# Patient Record
Sex: Female | Born: 1975 | Hispanic: Yes | Marital: Single | Smoking: Never smoker
Health system: Southern US, Community
[De-identification: ages and names within clinical notes are randomized; demographics above are authoritative.]

---

## 2013-05-03 ENCOUNTER — Encounter: Payer: Self-pay | Admitting: Obstetrics and Gynecology

## 2017-08-29 ENCOUNTER — Encounter (INDEPENDENT_AMBULATORY_CARE_PROVIDER_SITE_OTHER): Payer: Self-pay

## 2017-08-29 ENCOUNTER — Ambulatory Visit: Payer: Self-pay | Attending: Oncology

## 2017-08-29 VITALS — BP 114/78 | HR 61 | Temp 98.4°F | Ht 60.0 in | Wt 167.0 lb

## 2017-08-29 DIAGNOSIS — N63 Unspecified lump in unspecified breast: Secondary | ICD-10-CM

## 2017-08-29 NOTE — Progress Notes (Signed)
Subjective:     Patient ID: Kara Farmer, female   DOB: 21-Apr-1976, 42 y.o.   MRN: [id number]  HPI   Review of Systems     Objective:   Physical Exam  Pulmonary/Chest: Right breast exhibits no inverted nipple, no mass, no nipple discharge, no skin change and no tenderness. Left breast exhibits no inverted nipple, no mass, no nipple discharge, no skin change and no tenderness. Breasts are symmetrical.    Fibroglandular tissue upper outer quadrants bilateral; more prominent on left       Assessment:     42 year ols hispanic patient presents for [facility] clinic visit.  Patient complains of painful mass first noted 2 weeks ago that has gotten smaller in size.  Claretha CooperLoyda Murr interpreted exam.  No prior mammogram. Patient screened, and meets B[facility] eligibility.  Patient does not have insurance, Medicare or Medicaid.  Handout given on Affordable Care Act.Instructed patient on breast self awareness using teach back method.  CBE reveals bilateral upper outer quadrant fibroglandular tissue, more prominent on left.  No dominant mass palpated.  Patient staes she is had a pap at [facility] in January 2019.  Christy to request results.  Have a copy of [facility] pap [facility]  with negative /no HPV testing result.    Plan:     Joellyn QuailsChristy Burton to schedule for bilateral diagnostic mammogram, and ultrasound.

## 2017-09-01 ENCOUNTER — Ambulatory Visit
Admission: RE | Admit: 2017-09-01 | Discharge: 2017-09-01 | Disposition: A | Discharge status: Home / Self Care | Payer: Self-pay | Source: Ambulatory Visit | Attending: Oncology | Admitting: Oncology

## 2017-09-01 ENCOUNTER — Encounter: Payer: Self-pay | Admitting: Radiology

## 2017-09-01 DIAGNOSIS — N63 Unspecified lump in unspecified breast: Secondary | ICD-10-CM

## 2017-09-04 NOTE — Progress Notes (Signed)
Letter mailed from [facility] to notify of normal mammogram results.  Patient to return in one year for annual screening.  Copy to [facility]. 

## 2018-09-04 ENCOUNTER — Ambulatory Visit: Payer: Self-pay

## 2019-02-06 ENCOUNTER — Other Ambulatory Visit: Payer: Self-pay

## 2019-02-07 ENCOUNTER — Ambulatory Visit: Payer: Self-pay | Attending: Oncology | Admitting: *Deleted

## 2019-02-07 ENCOUNTER — Ambulatory Visit
Admission: RE | Admit: 2019-02-07 | Discharge: 2019-02-07 | Disposition: A | Discharge status: Home / Self Care | Payer: Self-pay | Source: Ambulatory Visit | Attending: Oncology | Admitting: Oncology

## 2019-02-07 ENCOUNTER — Encounter (INDEPENDENT_AMBULATORY_CARE_PROVIDER_SITE_OTHER): Payer: Self-pay

## 2019-02-07 ENCOUNTER — Other Ambulatory Visit: Payer: Self-pay

## 2019-02-07 ENCOUNTER — Encounter: Payer: Self-pay | Admitting: *Deleted

## 2019-02-07 VITALS — BP 114/77 | HR 58 | Temp 97.6°F | Ht 60.63 in | Wt 155.5 lb

## 2019-02-07 DIAGNOSIS — Z0000 Encounter for general adult medical examination without abnormal findings: Secondary | ICD-10-CM

## 2019-02-07 NOTE — Progress Notes (Signed)
  Subjective:     Patient ID: Kara Farmer, female   DOB: 11/18/1975, 43 y.o.   MRN: [id number]  HPI   Review of Systems     Objective:   Physical Exam Chest:     Breasts: Breasts are symmetrical.        Right: No swelling, bleeding, inverted nipple, mass, nipple discharge, skin change or tenderness.        Left: No swelling, bleeding, inverted nipple, mass, nipple discharge, skin change or tenderness.    Lymphadenopathy:     Upper Body:     Right upper body: No supraclavicular or axillary adenopathy.     Left upper body: No supraclavicular or axillary adenopathy.        Assessment:     43 year old Hispanic female returns to [facility] for annual screening.  Lloyda, the interpreter present during the interview and exam.  Clinical breast exam unremarkable.  Taught self breast awareness.  Patient with family history of breast cancer in her sister at age 75.  [place] breast cancer risk score is 18.7% and meets NCCN guidelines for genetic testing.  Discussed results and recommendation for genetic testing.  States she wants to think about it.  She is to call me back with her decision.  Last pap on 07/18/17 was negative/negative.  Next pap due in 2024. Patient has been screened for eligibility.  She does not have any insurance, Medicare or Medicaid.  She also meets financial eligibility.  Hand-out given on the Affordable Care Act.     Plan:     Screening mammogram ordered.  Will follow-up per [facility] protocol.

## 2019-02-08 ENCOUNTER — Encounter: Payer: Self-pay | Admitting: *Deleted

## 2019-02-08 NOTE — Progress Notes (Signed)
Letter mailed from the [facility] to inform patient of her normal mammogram results.  Patient is to follow-up with annual screening in one year.  [facility] to Christy. 

## 2020-04-15 ENCOUNTER — Other Ambulatory Visit: Payer: Self-pay

## 2020-04-15 ENCOUNTER — Ambulatory Visit: Payer: Self-pay | Attending: Oncology

## 2020-04-15 ENCOUNTER — Ambulatory Visit
Admission: RE | Admit: 2020-04-15 | Discharge: 2020-04-15 | Disposition: A | Discharge status: Home / Self Care | Payer: Self-pay | Source: Ambulatory Visit | Attending: Oncology | Admitting: Oncology

## 2020-04-15 VITALS — BP 120/66 | HR 61 | Temp 98.0°F | Resp 20 | Ht 61.0 in | Wt 153.5 lb

## 2020-04-15 DIAGNOSIS — Z0000 Encounter for general adult medical examination without abnormal findings: Secondary | ICD-10-CM

## 2020-04-15 NOTE — Progress Notes (Signed)
  Subjective:     Patient ID: Kara Farmer, female   DOB: 15-May-1976, 44 y.o.   MRN: [id number]  HPI   Review of Systems     Objective:   Physical Exam Chest:     Breasts:        Right: No swelling, bleeding, inverted nipple, mass, nipple discharge, skin change or tenderness.        Left: Tenderness present. No swelling, bleeding, inverted nipple, mass, nipple discharge or skin change.     Comments: Left upper outer breast tenderness-intermittent       Assessment:     44 year old Hispanic patient returns for annual [facility] screening.  Delos Haring interpreted exam. Patient screened, and meets [facility] eligibility.  Patient does not have insurance, Medicare or Medicaid. Instructed patient on breast self awareness using teach back method. Patient reports she is still having intermittent left breast tenderness.  Clinical breast exam unremarkable.      Plan:     Sent for bilateral screening mammogram.

## 2020-05-21 NOTE — Progress Notes (Signed)
Letter mailed from [facility] to notify of normal mammogram results.  Patient to return in one year for annual screening.  Copy to [facility]. 

## 2021-11-13 IMAGING — MG DIGITAL SCREENING BILAT W/ TOMO W/ CAD
8 series · 8 of 24 positions shown · non-contrast
Comparison: Previous exam(s).

CLINICAL DATA: Screening.

EXAM:
DIGITAL SCREENING BILATERAL MAMMOGRAM WITH TOMO AND CAD

[R MLO synth-2D]
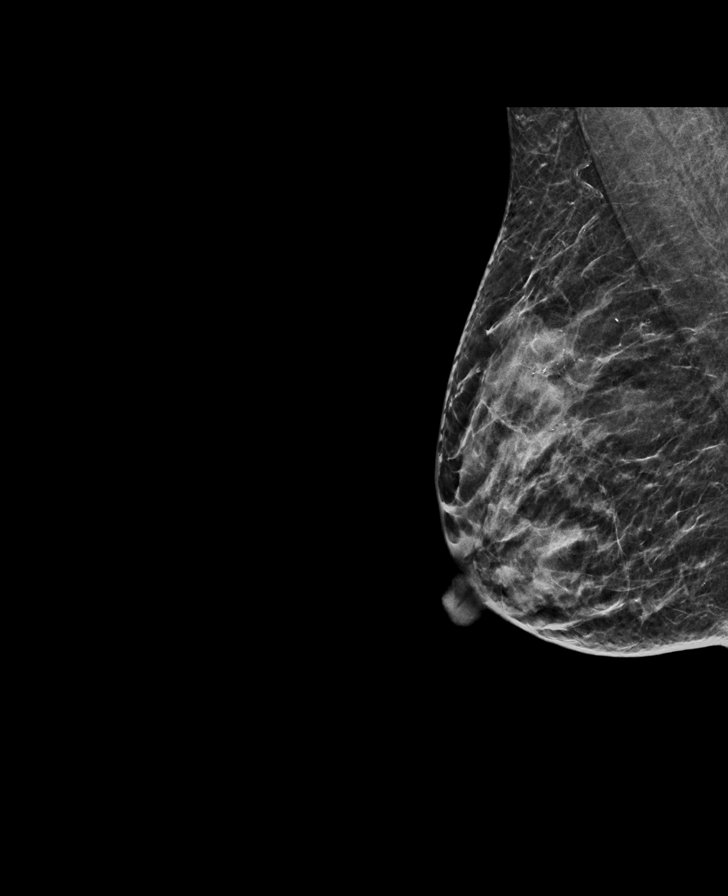

[L MLO synth-2D]
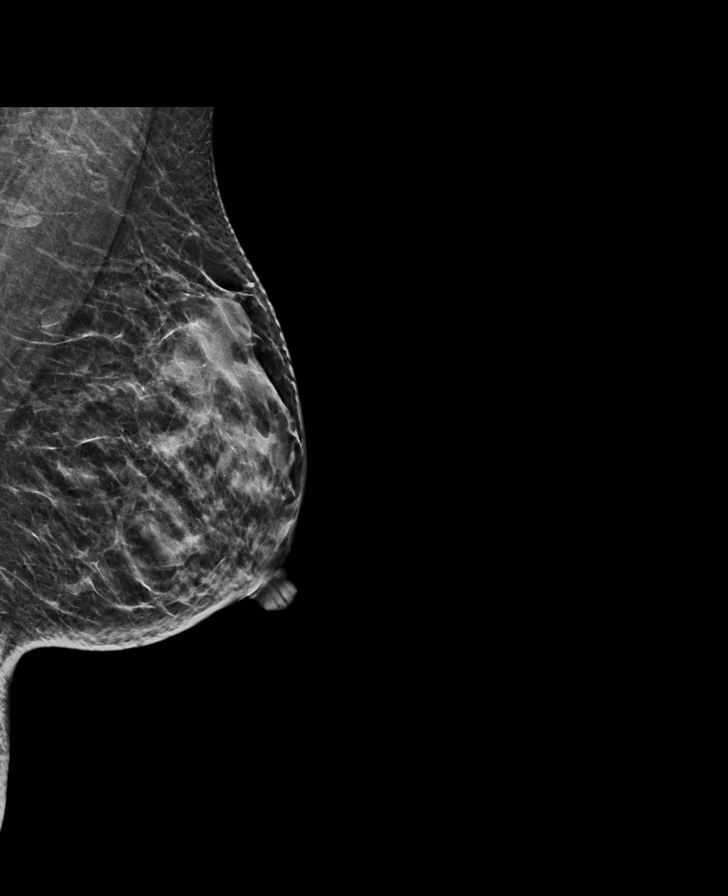

[R CC synth-2D]
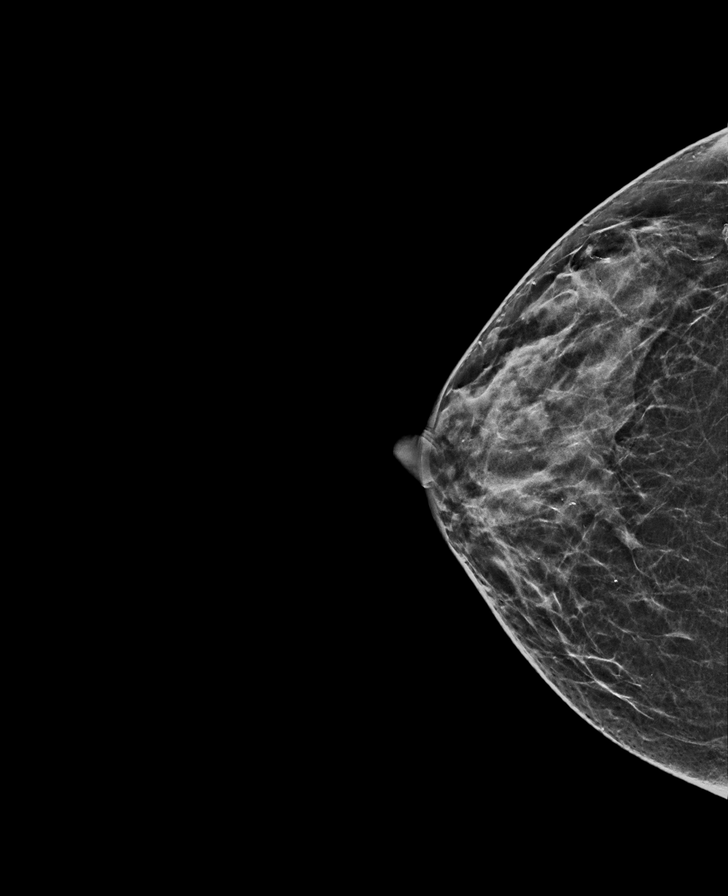

[L CC synth-2D]
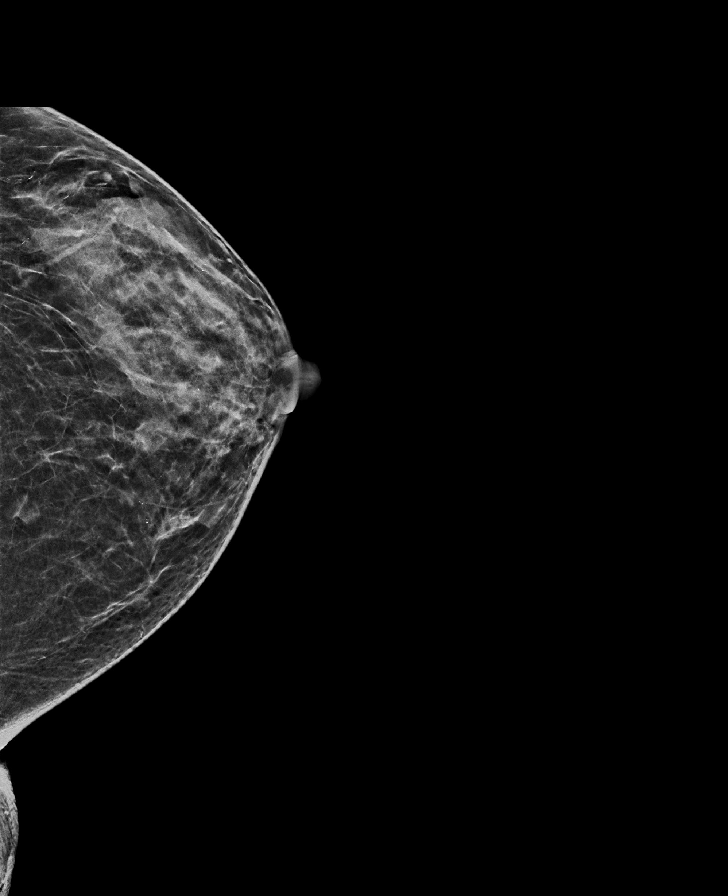

[R CC tomo · tomo slice 25/49.0]
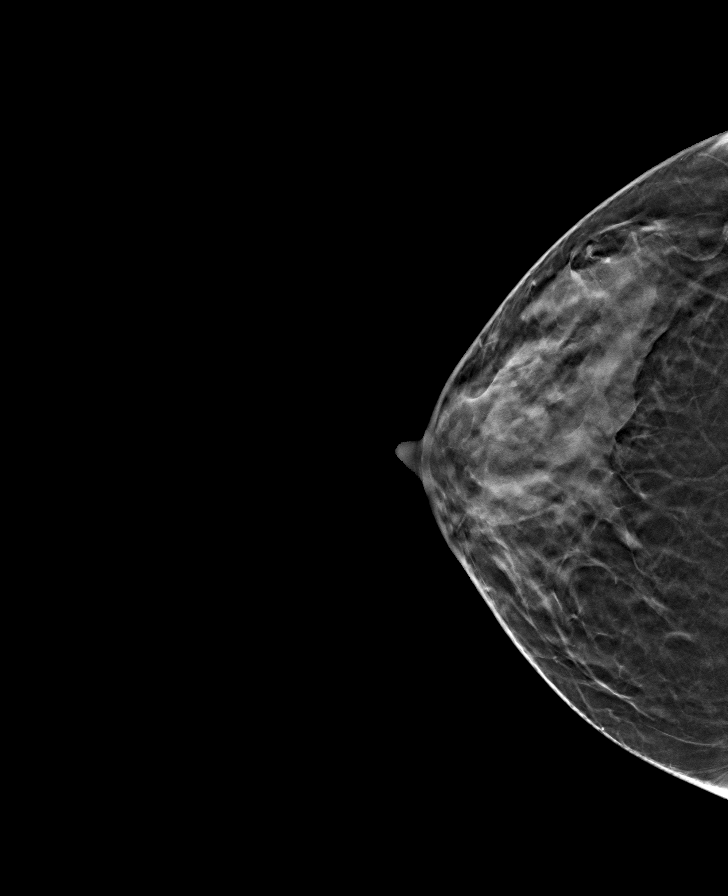

[R MLO tomo · tomo slice 25/49.0]
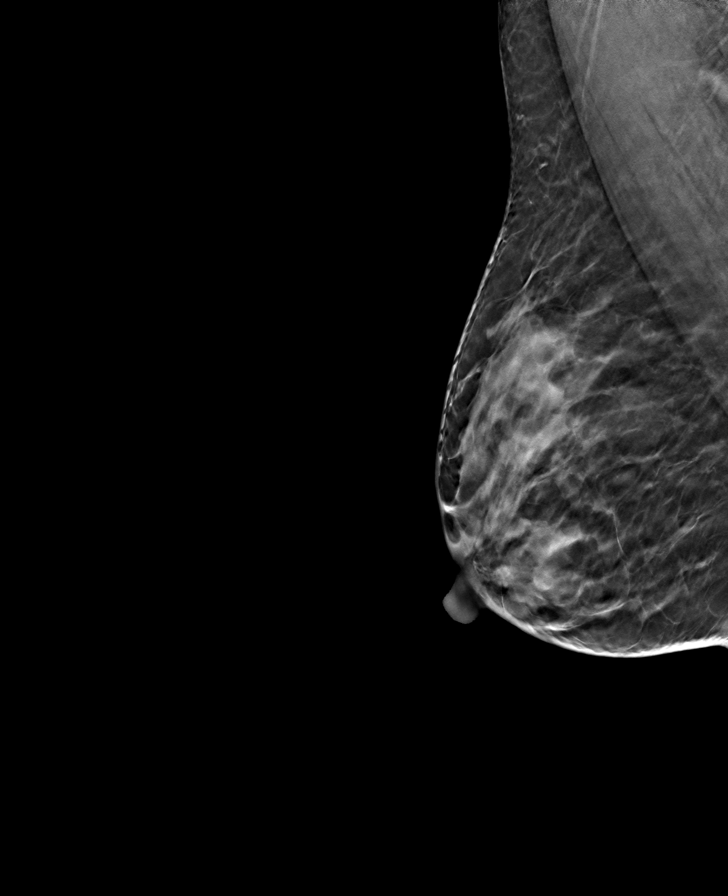

[L MLO tomo · tomo slice 27/53.0]
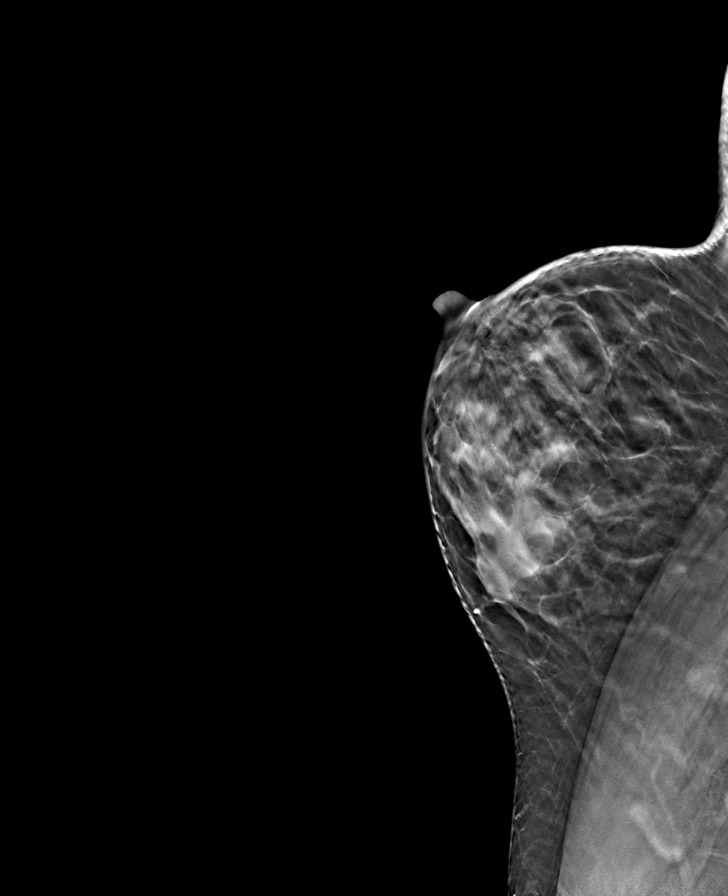

[L CC tomo · tomo slice 25/48.0]
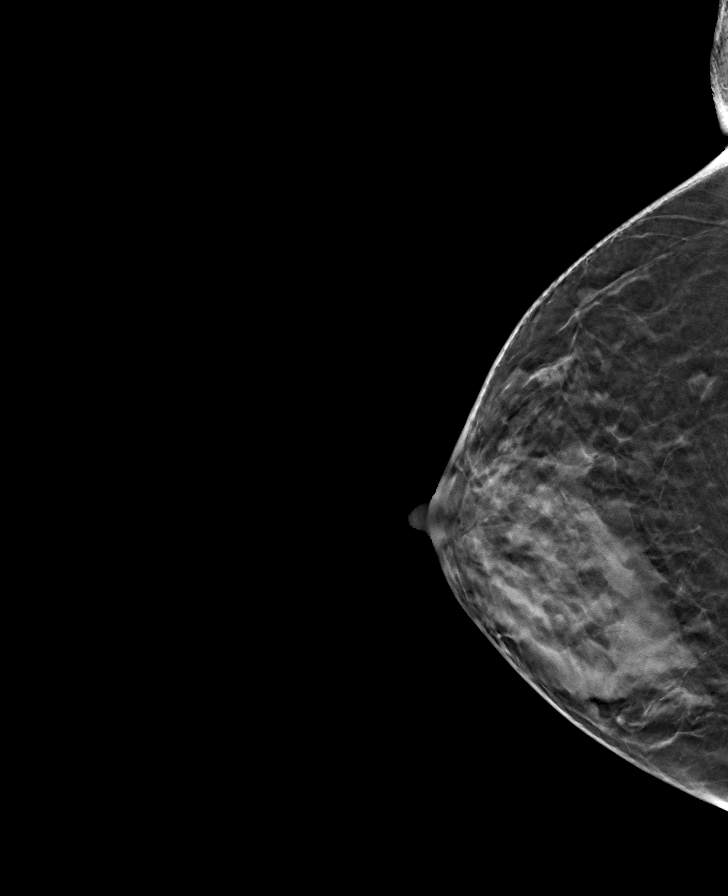

[8 of 24 positions shown; findings below may reference images not displayed]

ACR Breast Density Category c: The breast tissue is heterogeneously
dense, which may obscure small masses.
FINDINGS: There are no findings suspicious for malignancy. Images were
processed with CAD.
IMPRESSION: No mammographic evidence of malignancy. A result letter of this
screening mammogram will be mailed directly to the patient.

RECOMMENDATION:
Screening mammogram in one year. (Code:FT-U-LHB)

BI-RADS CATEGORY  1: Negative.

## 2022-01-18 ENCOUNTER — Other Ambulatory Visit: Payer: Self-pay

## 2022-01-18 DIAGNOSIS — Z1231 Encounter for screening mammogram for malignant neoplasm of breast: Secondary | ICD-10-CM

## 2022-01-19 ENCOUNTER — Encounter: Payer: Self-pay | Admitting: *Deleted

## 2022-01-19 ENCOUNTER — Ambulatory Visit
Admission: RE | Admit: 2022-01-19 | Discharge: 2022-01-19 | Disposition: A | Discharge status: Home / Self Care | Payer: Self-pay | Source: Ambulatory Visit | Attending: Obstetrics and Gynecology | Admitting: Obstetrics and Gynecology

## 2022-01-19 ENCOUNTER — Ambulatory Visit: Payer: Self-pay | Attending: Hematology and Oncology | Admitting: *Deleted

## 2022-01-19 VITALS — BP 121/75 | Wt 156.6 lb

## 2022-01-19 DIAGNOSIS — Z1231 Encounter for screening mammogram for malignant neoplasm of breast: Secondary | ICD-10-CM | POA: Insufficient documentation

## 2022-01-19 DIAGNOSIS — Z01419 Encounter for gynecological examination (general) (routine) without abnormal findings: Secondary | ICD-10-CM

## 2022-01-19 NOTE — Progress Notes (Signed)
Ms. Kara Farmer is a 46 y.o. female who presents to [facility] clinic today with no complaints . She presents for clinical breast exam and mammogram.    Pap Smear: Pap not smear completed today. Last Pap smear was on 07/18/17 at the [facility] and was  negative / negative . Per patient has no history of an abnormal Pap smear. Last Pap smear result is available in Epic.   Physical exam: Breasts Breasts symmetrical. No skin abnormalities bilateral breasts. No nipple retraction bilateral breasts. No nipple discharge bilateral breasts. No lymphadenopathy. No lumps palpated bilateral breasts.       Pelvic/Bimanual Pap is not indicated today    Smoking History: Patient has never smoked    Patient Navigation: Patient education provided. Taught self breast awareness.  Access to services provided for patient through [facility] program. Kandis Cocking, the [facility] interpreter provided interpretation. No transportation provided   Colorectal Cancer Screening: Per patient has never had colonoscopy completed.  Patient was given a FIT test by Bernestine Amass in April of 2023.  She is supposed to take the test back with her to her next appointment.   No complaints today.    Breast and Cervical Cancer Risk Assessment: Patient has family history of breast cancer in her sister at age 34, no known genetic mutations, or radiation treatment to the chest before age 75. Patient does not have history of cervical dysplasia, immunocompromised, or DES exposure in-utero.  Risk Assessment   No risk assessment data for the current encounter  Risk Scores       04/15/2020   Last edited by: Neita Garnet, CMA   5-year risk: 1 %   Lifetime risk: 13.7 %            A: B[facility]CP exam without pap smear P: Referred patient to the [facility] for a screening mammogram. Appointment scheduled for today.  Reviewed with patient that we had discussed with her in 2020 that she would be a candidate for genetic testing  based on family history of cancer in her sister at age 21.  Reviewed that she is still eligible to consider genetic testing.  Explained that [facility] could not pay for her genetic counseling, but there are ways to get testing at reduced rates or for free if eligible.  Discussed that at the minimum she should be sure and have her mammogram every year.  Will follow up per [facility] protocol.  Jim Like, RN 01/19/2022 10:39 AM

## 2024-07-11 ENCOUNTER — Ambulatory Visit: Payer: Self-pay

## 2024-07-11 DIAGNOSIS — Z23 Encounter for immunization: Secondary | ICD-10-CM

## 2024-07-11 NOTE — Progress Notes (Addendum)
 Presents for Hepatitis B, Varicella and Polio for immigration. Received Twinrix in 02/2024 and meets criteria for VFA Twinrix which was administered today. Call made to [employer] and to room to accept payment from client for private varicella and polio vaccines.  Client declined offer of [employer] Patient Assistance Program stating she did not want to have to wait for approval. Client tolerated vaccines without complaint. Counseled to check out at window # 8 and given encounter in closed blue folder. Burnadette Lowers, RN No charge LOS inadvertently selected and could not change this after chart signed. Burnadette Lowers, RN

## 2024-07-11 NOTE — Addendum Note (Signed)
 Addended by: BUTLER BURNADETTE POUR on: 07/11/2024 05:07 PM   Modules accepted: Level of Service
